# Patient Record
Sex: Male | Born: 1980 | Race: White | Hispanic: No | Marital: Married | Smoking: Never smoker
Health system: Southern US, Community
[De-identification: ages and names within clinical notes are randomized; demographics above are authoritative.]

## PROBLEM LIST (undated history)

## (undated) DIAGNOSIS — I10 Essential (primary) hypertension: Secondary | ICD-10-CM

## (undated) HISTORY — PX: LEG SURGERY: SHX1003

## (undated) HISTORY — PX: FOOT SURGERY: SHX648

---

## 2004-10-30 ENCOUNTER — Emergency Department: Payer: Self-pay | Admitting: Internal Medicine

## 2005-01-09 ENCOUNTER — Emergency Department: Payer: Self-pay | Admitting: Internal Medicine

## 2005-02-07 ENCOUNTER — Emergency Department: Payer: Self-pay | Admitting: Unknown Physician Specialty

## 2005-03-09 ENCOUNTER — Ambulatory Visit: Payer: Self-pay | Admitting: Podiatry

## 2005-04-28 ENCOUNTER — Ambulatory Visit: Payer: Self-pay | Admitting: Podiatry

## 2006-07-03 ENCOUNTER — Emergency Department: Payer: Self-pay | Admitting: Emergency Medicine

## 2006-07-03 ENCOUNTER — Other Ambulatory Visit: Payer: Self-pay

## 2006-09-12 ENCOUNTER — Emergency Department (HOSPITAL_COMMUNITY): Admission: EM | Admit: 2006-09-12 | Discharge: 2006-09-12 | Payer: Self-pay | Admitting: Emergency Medicine

## 2006-11-22 ENCOUNTER — Emergency Department (HOSPITAL_COMMUNITY): Admission: EM | Admit: 2006-11-22 | Discharge: 2006-11-22 | Payer: Self-pay | Admitting: Emergency Medicine

## 2007-01-31 ENCOUNTER — Emergency Department (HOSPITAL_COMMUNITY): Admission: EM | Admit: 2007-01-31 | Discharge: 2007-01-31 | Payer: Self-pay | Admitting: Emergency Medicine

## 2007-07-28 IMAGING — CR DG CHEST 1V PORT
1 series · 1 of 1 positions shown · non-contrast
Comparison: none

CLINICAL DATA: Chest pain, short of breath, cough. 
 PORTABLE CHEST ? 1 VIEW ? 11/22/06 AT 0735 HOURS:

[view not recorded]
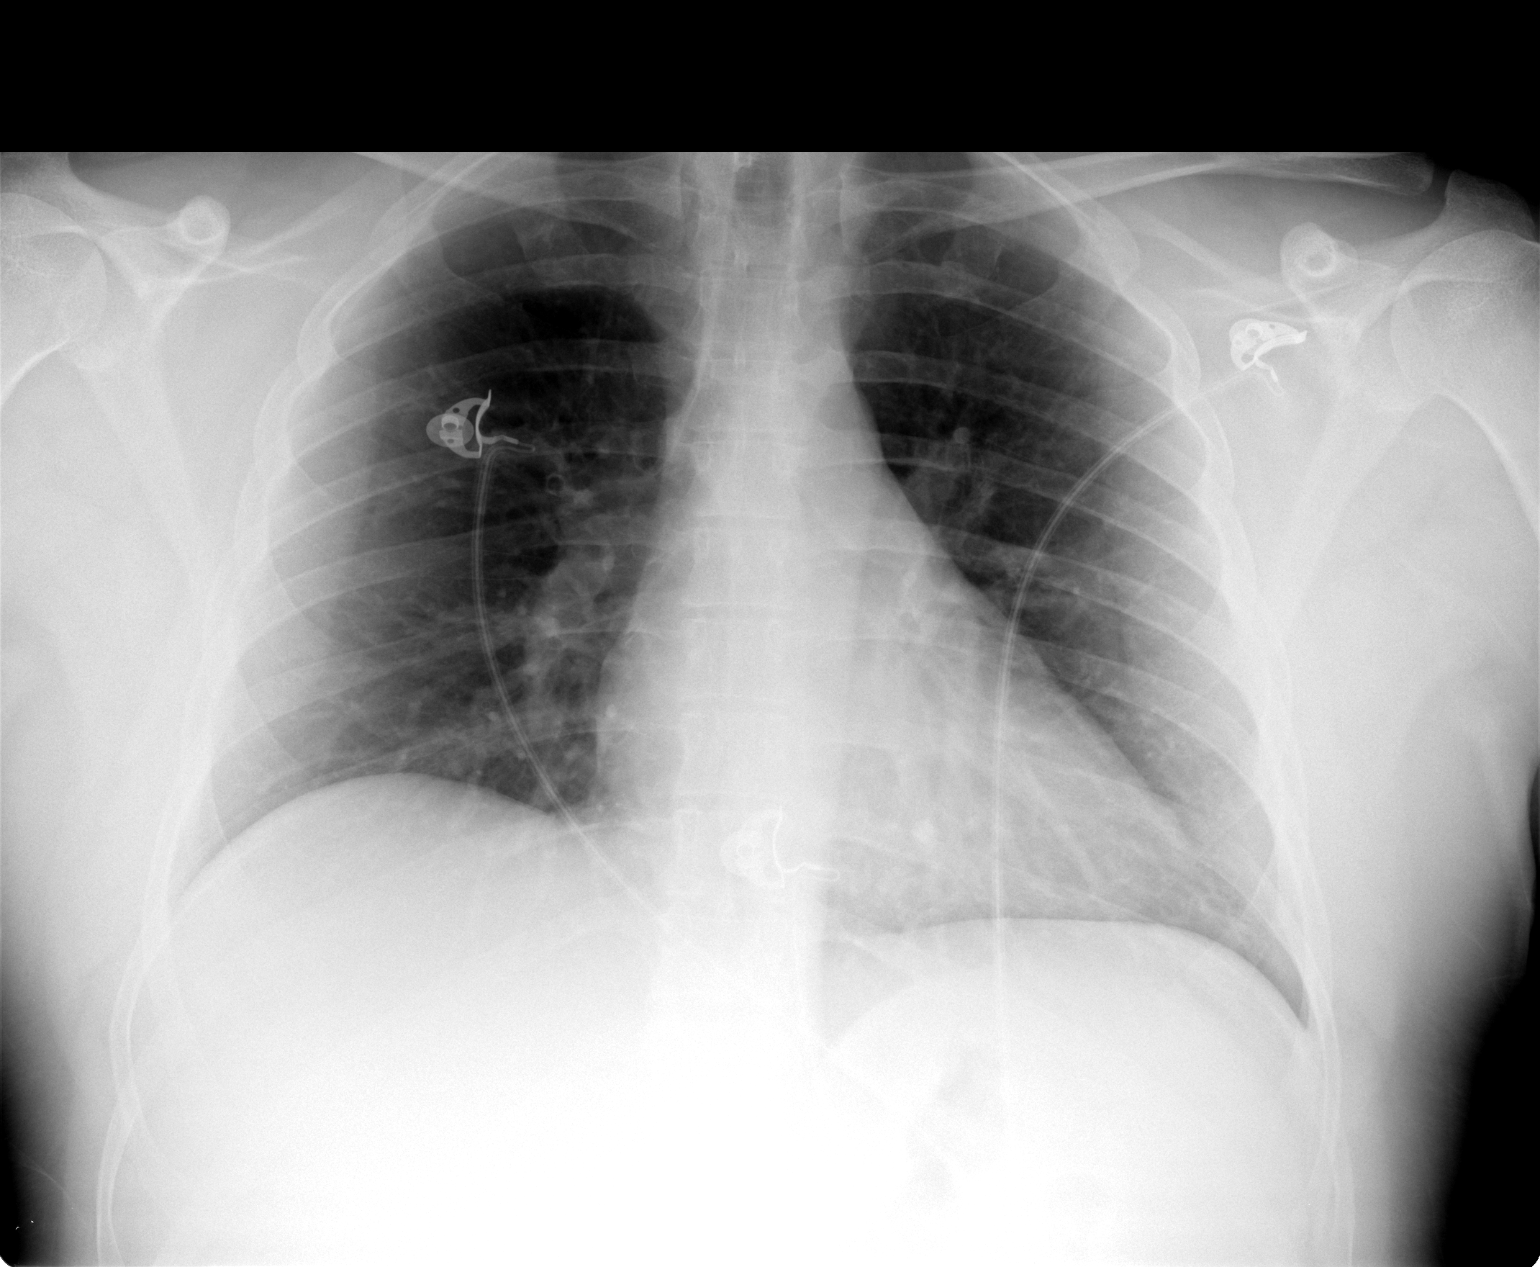

[1 of 1 positions shown; findings below may reference images not displayed]

FINDINGS: The lungs are clear.  The heart is within normal limits in size.  No bony abnormality is seen.
IMPRESSION: No active lung disease.

## 2008-06-26 ENCOUNTER — Emergency Department (HOSPITAL_COMMUNITY): Admission: EM | Admit: 2008-06-26 | Discharge: 2008-06-26 | Payer: Self-pay | Admitting: Emergency Medicine

## 2009-03-01 IMAGING — CR DG CHEST 2V
2 series · 2 of 2 positions shown · non-contrast
Comparison: 01/31/2007

CLINICAL DATA: Chest pain

CHEST - 2 VIEW

[view not recorded (1 of 2)]
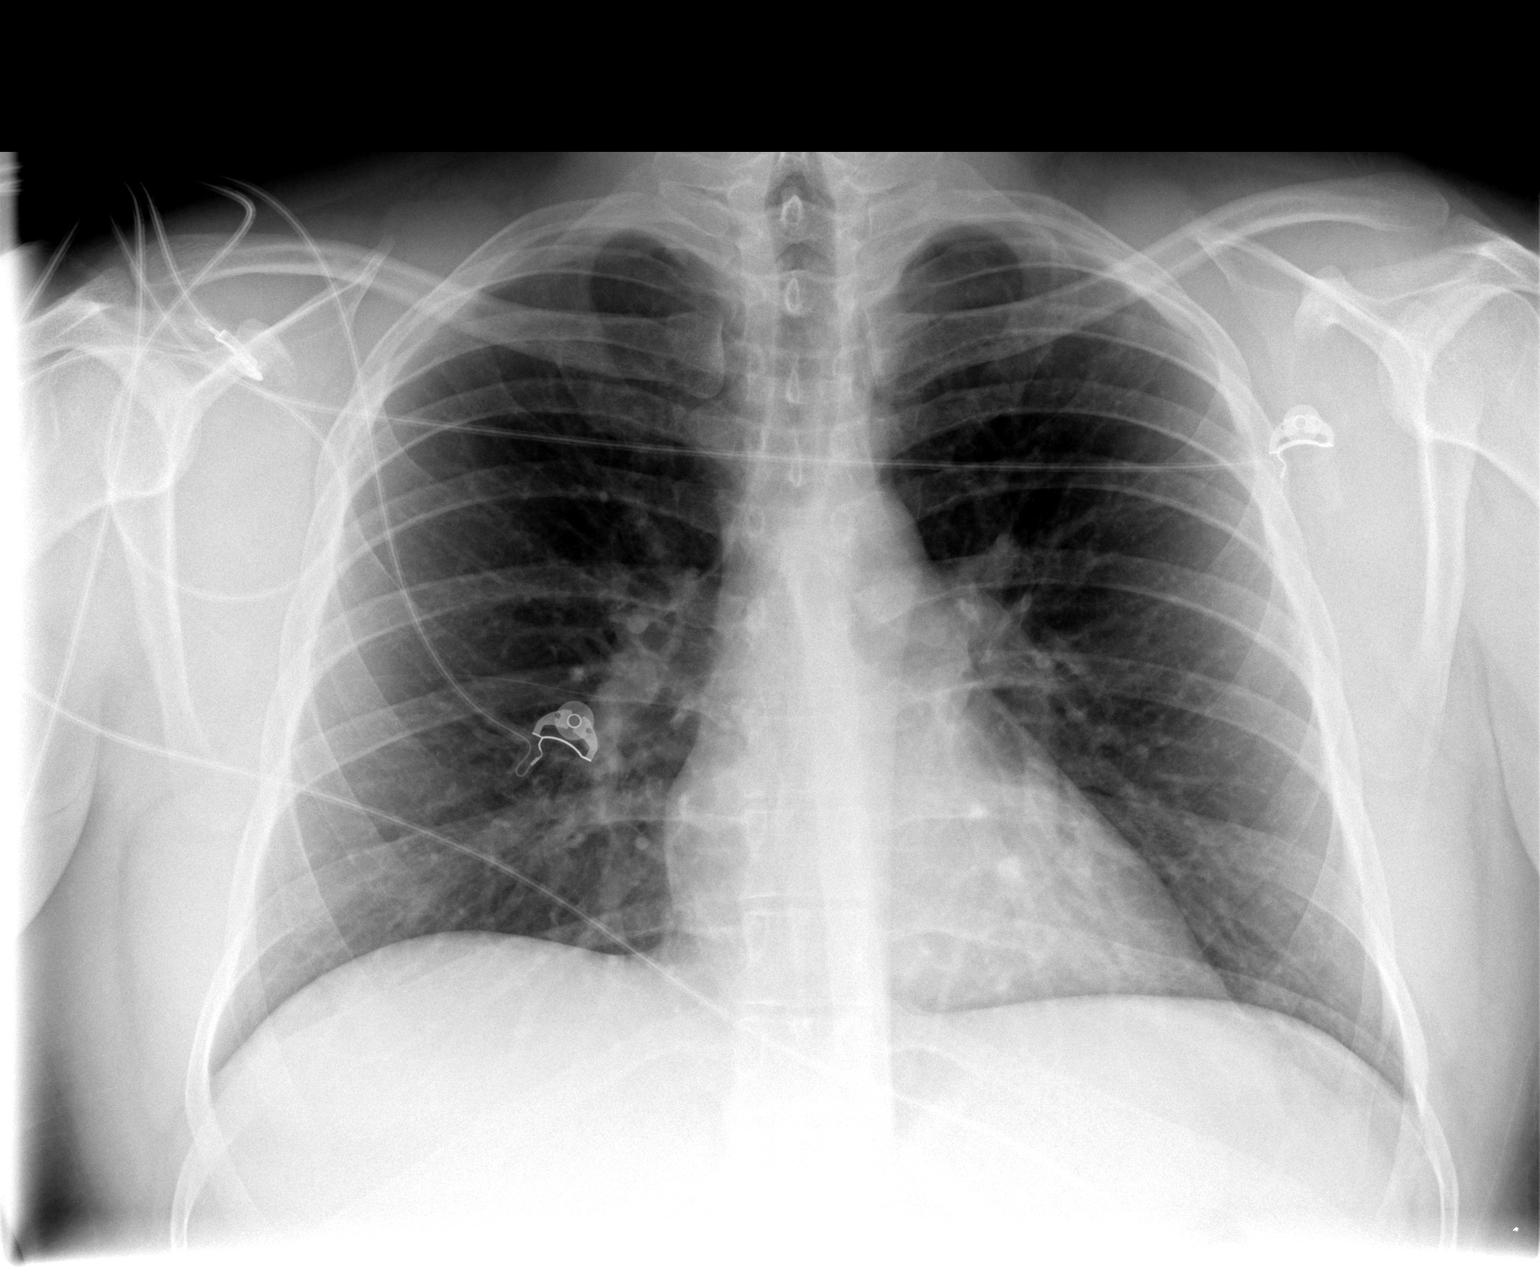

[view not recorded (2 of 2)]
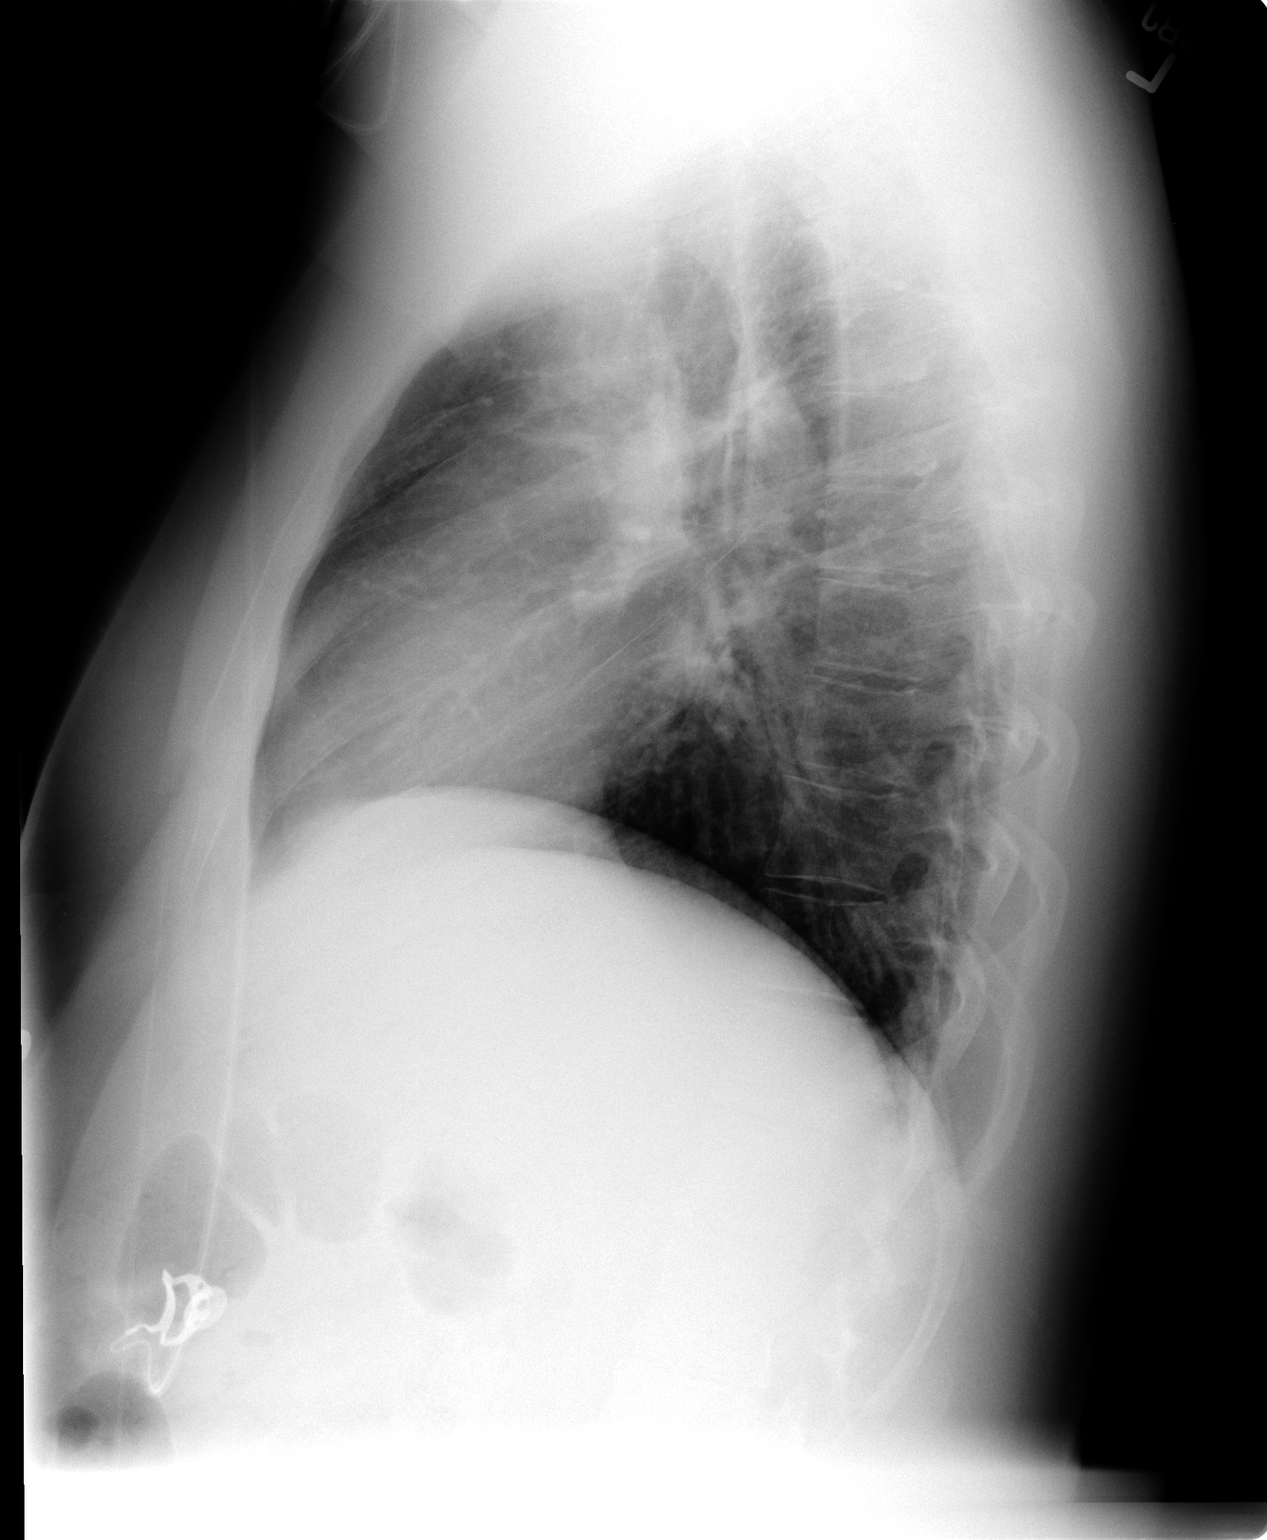

[2 of 2 positions shown; findings below may reference images not displayed]

FINDINGS: The heart size and mediastinal contours are within normal
limits.  Both lungs are clear.  The visualized skeletal structures
are unremarkable.
IMPRESSION: No active cardiopulmonary disease.

## 2010-07-20 ENCOUNTER — Emergency Department (HOSPITAL_COMMUNITY): Admission: EM | Admit: 2010-07-20 | Discharge: 2010-07-20 | Payer: Self-pay | Admitting: Emergency Medicine

## 2011-07-19 LAB — D-DIMER, QUANTITATIVE: D-Dimer, Quant: 0.37

## 2011-07-19 LAB — POCT CARDIAC MARKERS
Myoglobin, poc: 73.3
Myoglobin, poc: 86.3

## 2011-11-06 ENCOUNTER — Emergency Department (HOSPITAL_COMMUNITY)
Admission: EM | Admit: 2011-11-06 | Discharge: 2011-11-06 | Disposition: A | Discharge status: Home / Self Care | Payer: Worker's Compensation | Attending: Emergency Medicine | Admitting: Emergency Medicine

## 2011-11-06 ENCOUNTER — Encounter (HOSPITAL_COMMUNITY): Payer: Self-pay

## 2011-11-06 DIAGNOSIS — W268XXA Contact with other sharp object(s), not elsewhere classified, initial encounter: Secondary | ICD-10-CM | POA: Insufficient documentation

## 2011-11-06 DIAGNOSIS — I10 Essential (primary) hypertension: Secondary | ICD-10-CM | POA: Insufficient documentation

## 2011-11-06 DIAGNOSIS — S61218A Laceration without foreign body of other finger without damage to nail, initial encounter: Secondary | ICD-10-CM

## 2011-11-06 DIAGNOSIS — Z23 Encounter for immunization: Secondary | ICD-10-CM | POA: Insufficient documentation

## 2011-11-06 DIAGNOSIS — Y92009 Unspecified place in unspecified non-institutional (private) residence as the place of occurrence of the external cause: Secondary | ICD-10-CM | POA: Insufficient documentation

## 2011-11-06 DIAGNOSIS — S61209A Unspecified open wound of unspecified finger without damage to nail, initial encounter: Secondary | ICD-10-CM | POA: Insufficient documentation

## 2011-11-06 HISTORY — DX: Essential (primary) hypertension: I10

## 2011-11-06 MED ORDER — LIDOCAINE HCL (PF) 1 % IJ SOLN
INTRAMUSCULAR | Status: AC
  Filled 2011-11-06: qty 5

## 2011-11-06 MED ORDER — TETANUS-DIPHTH-ACELL PERTUSSIS 5-2.5-18.5 LF-MCG/0.5 IM SUSP
0.5000 mL | Freq: Once | INTRAMUSCULAR | Status: AC
  Administered 2011-11-06: 0.5 mL via INTRAMUSCULAR
  Filled 2011-11-06: qty 0.5

## 2011-11-06 MED ORDER — LIDOCAINE HCL (PF) 1 % IJ SOLN
INTRAMUSCULAR | Status: AC
  Administered 2011-11-06: 20:00:00
  Filled 2011-11-06: qty 5

## 2011-11-06 NOTE — ED Notes (Signed)
2 sutures placed by EDP. Pt tolerated well. Bleeding controled.

## 2011-11-06 NOTE — ED Notes (Signed)
Pt reports was cutting some plastic at work and accidentally cut left index finger.  Last tetanus shot was 5 years ago.

## 2011-11-06 NOTE — ED Provider Notes (Signed)
History     CSN: [id number]  Arrival date & time 11/06/11  1826   First MD Initiated Contact with Patient 11/06/11 1842      Chief Complaint  Patient presents with  . Laceration    (Consider location/radiation/quality/duration/timing/severity/associated sxs/prior treatment) Patient is a 31 y.o. male presenting with skin laceration. The history is provided by the patient. No language interpreter was used.  Laceration  The incident occurred 1 to 2 hours ago. The laceration is located on the left hand (installing a washing machine in a customer's house.  cut thru a rubber hose and stabbed his L 2nd finger.). The laceration is 1 cm in size. Injury mechanism: utility knife. The pain is at a severity of 6/10. The pain has been intermittent since onset. He reports no foreign bodies present. His tetanus status is out of date.    Past Medical History  Diagnosis Date  . Hypertension     Past Surgical History  Procedure Date  . Leg surgery   . Foot surgery     No family history on file.  History  Substance Use Topics  . Smoking status: Never Smoker   . Smokeless tobacco: Not on file  . Alcohol Use: No      Review of Systems  Skin: Positive for wound.  All other systems reviewed and are negative.    Allergies  Morphine and related  Home Medications  No current outpatient prescriptions on file.  BP 148/94  Pulse 88  Temp(Src) 98 F (36.7 C) (Oral)  Resp 20  Ht 5' 4.5" (1.638 m)  Wt 210 lb (95.255 kg)  BMI 35.49 kg/m2  SpO2 97%  Physical Exam  Nursing note and vitals reviewed. Constitutional: He is oriented to person, place, and time. He appears well-developed and well-nourished.  HENT:  Head: Normocephalic and atraumatic.  Eyes: EOM are normal.  Neck: Normal range of motion.  Cardiovascular: Normal rate, regular rhythm, normal heart sounds and intact distal pulses.   Pulmonary/Chest: Effort normal and breath sounds normal. No respiratory distress.    Abdominal: Soft. He exhibits no distension. There is no tenderness.  Musculoskeletal: Normal range of motion. He exhibits tenderness.       Left hand: He exhibits tenderness and laceration. He exhibits normal range of motion, no bony tenderness, normal capillary refill, no deformity and no swelling. normal sensation noted. Normal strength noted.       Hands: Neurological: He is alert and oriented to person, place, and time.  Skin: Skin is warm and dry.  Psychiatric: He has a normal mood and affect. Judgment normal.    ED Course  LACERATION REPAIR Date/Time: 11/06/2011 7:25 PM Performed by: Worthy Rancher Authorized by: Worthy Rancher Consent: Verbal consent obtained. Written consent not obtained. Risks and benefits: risks, benefits and alternatives were discussed Consent given by: patient Patient understanding: patient states understanding of the procedure being performed Patient consent: the patient's understanding of the procedure matches consent given Imaging studies: imaging studies not available Required items: required blood products, implants, devices, and special equipment available Patient identity confirmed: verbally with patient Time out: Immediately prior to procedure a "time out" was called to verify the correct patient, procedure, equipment, support staff and site/side marked as required. Body area: upper extremity Location details: left index finger Laceration length: 1 cm Foreign bodies: no foreign bodies Tendon involvement: none Nerve involvement: none Vascular damage: no Anesthesia: local infiltration Local anesthetic: lidocaine 1% without epinephrine Anesthetic total: 3 ml Patient sedated: no  Preparation: Patient was prepped and draped in the usual sterile fashion. Irrigation solution: saline Irrigation method: syringe Amount of cleaning: standard Debridement: none Degree of undermining: none Skin closure: 4-0 nylon Number of sutures: 2 Technique:  simple Approximation: close Approximation difficulty: simple Dressing: antibiotic ointment and tube gauze Patient tolerance: Patient tolerated the procedure well with no immediate complications.   (including critical care time)  Labs Reviewed - No data to display No results found.   No diagnosis found.    MDM          Worthy Rancher, PA 11/06/11 1944

## 2011-11-06 NOTE — ED Notes (Signed)
Pt presents with left finger laceration that he states he received while on the job. Pt reports cutting plastic wrap from an appliance hose when he cut his lt index finger with a razor knife. Bleeding under control.

## 2011-11-06 NOTE — ED Provider Notes (Signed)
Medical screening examination/treatment/procedure(s) were performed by non-physician practitioner and as supervising physician I was immediately available for consultation/collaboration.  Jann Ra R. Naveh Rickles, MD 11/06/11 2334 

## 2013-11-06 ENCOUNTER — Emergency Department (HOSPITAL_COMMUNITY): Payer: Managed Care, Other (non HMO)

## 2013-11-06 ENCOUNTER — Encounter (HOSPITAL_COMMUNITY): Payer: Self-pay | Admitting: Emergency Medicine

## 2013-11-06 ENCOUNTER — Emergency Department (HOSPITAL_COMMUNITY)
Admission: EM | Admit: 2013-11-06 | Discharge: 2013-11-06 | Disposition: A | Discharge status: Home / Self Care | Payer: Managed Care, Other (non HMO) | Attending: Emergency Medicine | Admitting: Emergency Medicine

## 2013-11-06 DIAGNOSIS — I10 Essential (primary) hypertension: Secondary | ICD-10-CM | POA: Insufficient documentation

## 2013-11-06 DIAGNOSIS — R079 Chest pain, unspecified: Secondary | ICD-10-CM | POA: Insufficient documentation

## 2013-11-06 LAB — CBC WITH DIFFERENTIAL/PLATELET
BASOS ABS: 0 10*3/uL (ref 0.0–0.1)
Basophils Relative: 0 % (ref 0–1)
EOS PCT: 1 % (ref 0–5)
Eosinophils Absolute: 0.1 10*3/uL (ref 0.0–0.7)
HCT: 44.2 % (ref 39.0–52.0)
Hemoglobin: 14.8 g/dL (ref 13.0–17.0)
LYMPHS ABS: 2.1 10*3/uL (ref 0.7–4.0)
LYMPHS PCT: 25 % (ref 12–46)
MCH: 30.3 pg (ref 26.0–34.0)
MCHC: 33.5 g/dL (ref 30.0–36.0)
MCV: 90.4 fL (ref 78.0–100.0)
MONO ABS: 0.5 10*3/uL (ref 0.1–1.0)
Monocytes Relative: 5 % (ref 3–12)
NEUTROS ABS: 6 10*3/uL (ref 1.7–7.7)
NEUTROS PCT: 69 % (ref 43–77)
PLATELETS: 234 10*3/uL (ref 150–400)
RBC: 4.89 MIL/uL (ref 4.22–5.81)
RDW: 12.8 % (ref 11.5–15.5)
WBC: 8.7 10*3/uL (ref 4.0–10.5)

## 2013-11-06 LAB — BASIC METABOLIC PANEL
BUN: 10 mg/dL (ref 6–23)
CALCIUM: 9.8 mg/dL (ref 8.4–10.5)
CO2: 27 meq/L (ref 19–32)
CREATININE: 0.92 mg/dL (ref 0.50–1.35)
Chloride: 99 mEq/L (ref 96–112)
GFR calc non Af Amer: >90 mL/min (ref >90)
Glucose, Bld: 91 mg/dL (ref 70–99)
Potassium: 3.7 mEq/L (ref 3.7–5.3)
SODIUM: 140 meq/L (ref 137–147)

## 2013-11-06 LAB — TROPONIN I

## 2013-11-06 MED ORDER — NAPROXEN 500 MG PO TABS: 500.0000 mg | ORAL_TABLET | Freq: Two times a day (BID) | ORAL | Status: AC

## 2013-11-06 MED ORDER — CYCLOBENZAPRINE HCL 10 MG PO TABS: 10.0000 mg | ORAL_TABLET | Freq: Two times a day (BID) | ORAL | Status: AC | PRN

## 2013-11-06 NOTE — ED Provider Notes (Signed)
CSN: [id number]     Arrival date & time 11/06/13  1254 History   First MD Initiated Contact with Patient 11/06/13 1311     Chief Complaint  Patient presents with  . Chest Pain   (Consider location/radiation/quality/duration/timing/severity/associated sxs/prior Treatment) HPI.... Sharp left  anterior chest pain intermittently for one week, getting worse the past 24 hours. Nothing makes symptoms better or worse. No dyspnea, diaphoresis, nausea. No chronic medical problems. Nonsmoker. No family history of heart disease. Severity is mild to moderate   Past Medical History  Diagnosis Date  . Hypertension    Past Surgical History  Procedure Laterality Date  . Leg surgery    . Foot surgery     No family history on file. History  Substance Use Topics  . Smoking status: Never Smoker   . Smokeless tobacco: Not on file  . Alcohol Use: No    Review of Systems  All other systems reviewed and are negative.    Allergies  Morphine and related  Home Medications   Current Outpatient Rx  Name  Route  Sig  Dispense  Refill  . ibuprofen (ADVIL,MOTRIN) 200 MG tablet   Oral   Take 400 mg by mouth 2 (two) times daily as needed for moderate pain.         . cyclobenzaprine (FLEXERIL) 10 MG tablet   Oral   Take 1 tablet (10 mg total) by mouth 2 (two) times daily as needed for muscle spasms.   20 tablet   0   . naproxen (NAPROSYN) 500 MG tablet   Oral   Take 1 tablet (500 mg total) by mouth 2 (two) times daily.   20 tablet   0    BP 137/86  Pulse 87  Temp(Src) 98 F (36.7 C) (Oral)  Resp 20  Ht 5\' 4"  (1.626 m)  Wt 195 lb (88.451 kg)  BMI 33.46 kg/m2  SpO2 100% Physical Exam  Nursing note and vitals reviewed. Constitutional: He is oriented to person, place, and time. He appears well-developed and well-nourished.  HENT:  Head: Normocephalic and atraumatic.  Eyes: Conjunctivae and EOM are normal. Pupils are equal, round, and reactive to light.  Neck: Normal range of motion.  Neck supple.  Cardiovascular: Normal rate, regular rhythm and normal heart sounds.   Pulmonary/Chest: Effort normal and breath sounds normal.  Abdominal: Soft. Bowel sounds are normal.  Musculoskeletal: Normal range of motion.  Neurological: He is alert and oriented to person, place, and time.  Skin: Skin is warm and dry.  Psychiatric: He has a normal mood and affect. His behavior is normal.    ED Course  Procedures (including critical care time) Labs Review Labs Reviewed  CBC WITH DIFFERENTIAL  BASIC METABOLIC PANEL  TROPONIN I   Imaging Review Dg Chest Portable 1 View  11/06/2013   CLINICAL DATA:  Chest pain  EXAM: PORTABLE CHEST - 1 VIEW  COMPARISON:  June 26, 2008  FINDINGS: Lungs are clear. Heart size and pulmonary vascularity are normal. No pneumothorax. No adenopathy. No bone lesions.  IMPRESSION: No abnormality noted.   Electronically Signed   By: Bretta Bang M.D.   On: 11/06/2013 13:41    EKG Interpretation    Date/Time:  Thursday November 06 2013 13:02:46 EST Ventricular Rate:  82 PR Interval:  146 QRS Duration: 88 QT Interval:  342 QTC Calculation: 399 R Axis:   37 Text Interpretation:  Normal sinus rhythm Cannot rule out Anterior infarct , age undetermined Abnormal ECG When compared  with ECG of 26-Jun-2008 01:44, No significant change was found Confirmed by Philis Doke  MD, Ginger Leeth ([id number]) on 11/06/2013 1:39:21 PM            MDM   1. Chest pain    patient is very low risk for ACS or PE.   Vital signs normal.  Screening labs, EKG, chest x-ray all negative.  Discharge medications Naprosyn 500 mg and Flexeril 10 mg    Donnetta HutchingBrian Agustine Rossitto, MD 11/06/13 1537

## 2013-11-06 NOTE — ED Notes (Signed)
Complain of chest pain for about a week. Indicated upper left chest but state left arm feels numb at times

## 2013-11-06 NOTE — Discharge Instructions (Signed)
All tests were normal.  Meds for pain and muscle spasm.  F/U your dr

## 2014-07-12 IMAGING — CR DG CHEST 1V PORT
1 series · 1 of 1 positions shown · non-contrast
Comparison: June 26, 2008

CLINICAL DATA: Chest pain

EXAM:
PORTABLE CHEST - 1 VIEW

[portable]
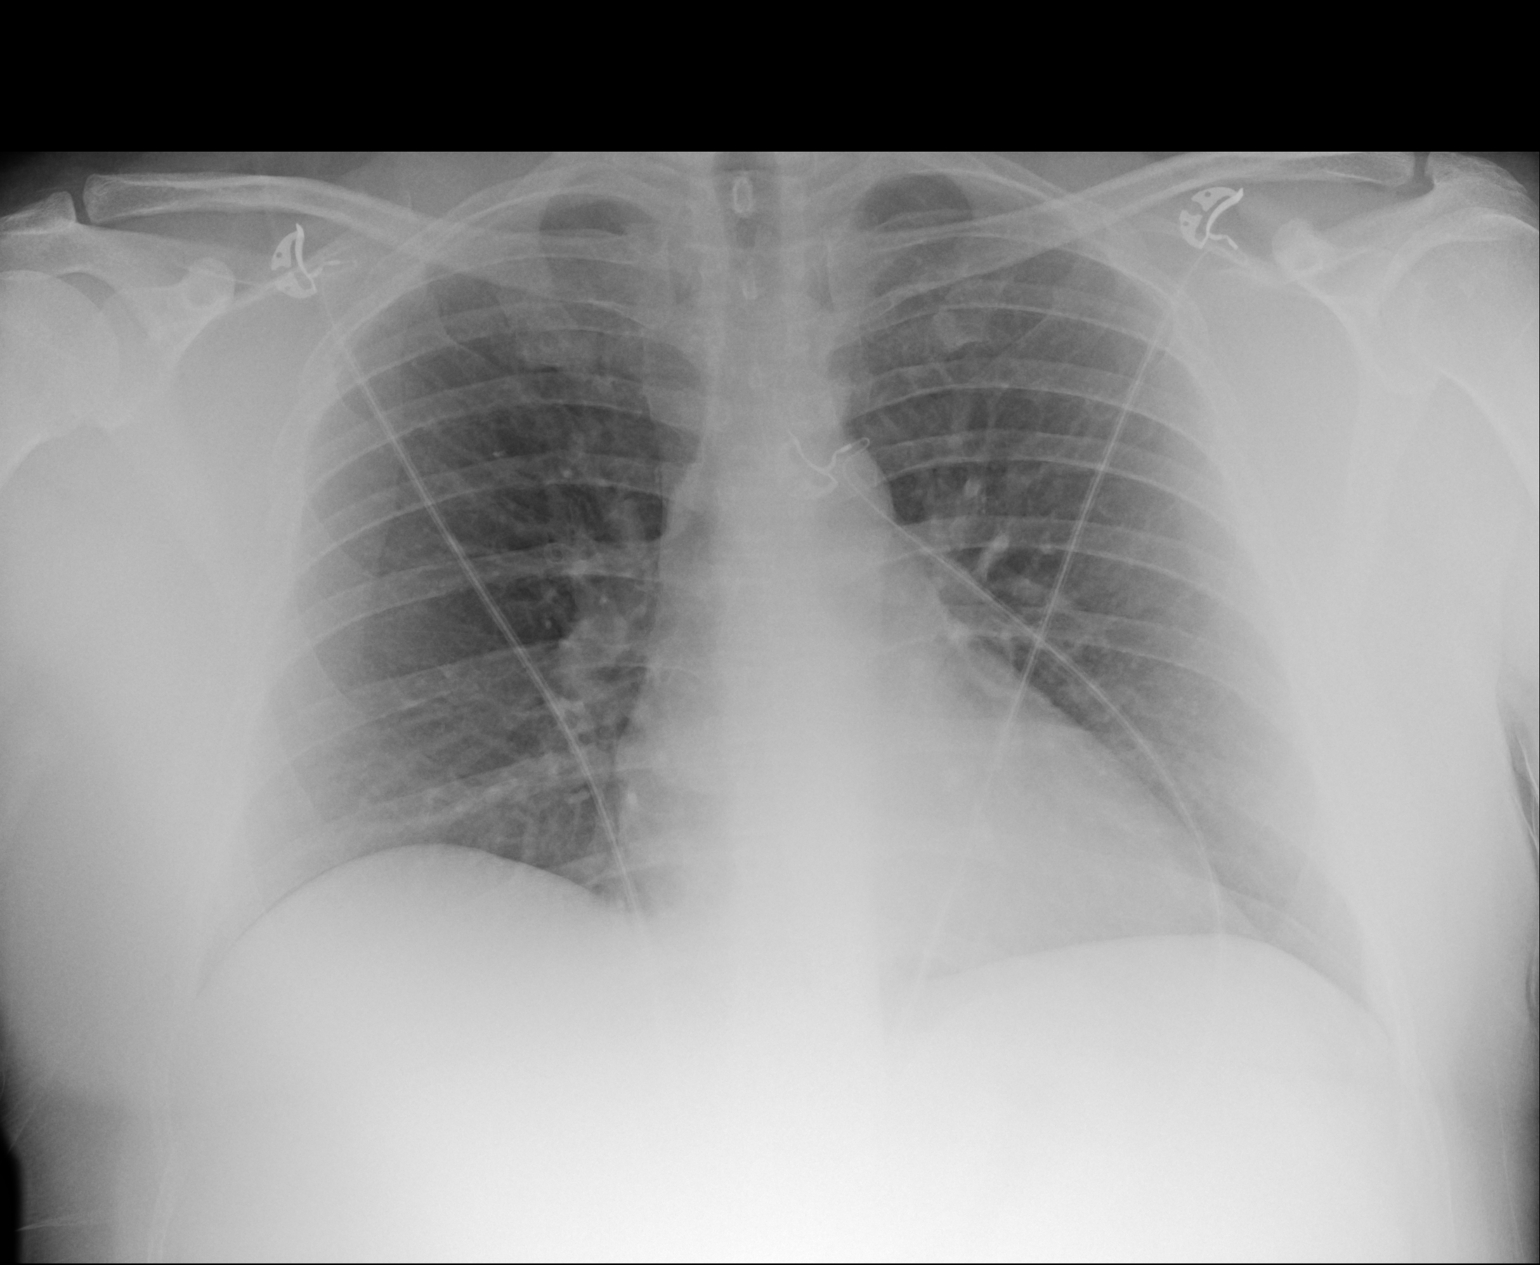

[1 of 1 positions shown; findings below may reference images not displayed]

FINDINGS: Lungs are clear. Heart size and pulmonary vascularity are normal. No
pneumothorax. No adenopathy. No bone lesions.
IMPRESSION: No abnormality noted.
